# Patient Record
Sex: Male | Born: 1990 | Race: White | Hispanic: No | Marital: Single | Smoking: Current every day smoker
Health system: Southern US, Community
[De-identification: ages and names within clinical notes are randomized; demographics above are authoritative.]

## PROBLEM LIST (undated history)

## (undated) DIAGNOSIS — J45909 Unspecified asthma, uncomplicated: Secondary | ICD-10-CM

---

## 2013-11-28 ENCOUNTER — Encounter (HOSPITAL_COMMUNITY): Payer: Self-pay | Admitting: Emergency Medicine

## 2013-11-28 ENCOUNTER — Emergency Department (HOSPITAL_COMMUNITY): Payer: Managed Care, Other (non HMO)

## 2013-11-28 ENCOUNTER — Emergency Department (HOSPITAL_COMMUNITY)
Admission: EM | Admit: 2013-11-28 | Discharge: 2013-11-28 | Disposition: A | Discharge status: Home / Self Care | Payer: Managed Care, Other (non HMO) | Attending: Emergency Medicine | Admitting: Emergency Medicine

## 2013-11-28 DIAGNOSIS — F17200 Nicotine dependence, unspecified, uncomplicated: Secondary | ICD-10-CM | POA: Diagnosis not present

## 2013-11-28 DIAGNOSIS — J40 Bronchitis, not specified as acute or chronic: Secondary | ICD-10-CM

## 2013-11-28 DIAGNOSIS — J45901 Unspecified asthma with (acute) exacerbation: Secondary | ICD-10-CM

## 2013-11-28 HISTORY — DX: Unspecified asthma, uncomplicated: J45.909

## 2013-11-28 MED ORDER — IBUPROFEN 800 MG PO TABS
800.0000 mg | ORAL_TABLET | Freq: Once | ORAL | Status: AC
Start: 2013-11-28 — End: 2013-11-28
  Administered 2013-11-28: 800 mg via ORAL
  Filled 2013-11-28: qty 1

## 2013-11-28 MED ORDER — IPRATROPIUM-ALBUTEROL 0.5-2.5 (3) MG/3ML IN SOLN
3.0000 mL | Freq: Once | RESPIRATORY_TRACT | Status: AC
  Administered 2013-11-28: 3 mL via RESPIRATORY_TRACT
  Filled 2013-11-28: qty 3

## 2013-11-28 MED ORDER — PREDNISONE 10 MG PO TABS: 60.0000 mg | ORAL_TABLET | Freq: Every day | ORAL | Status: DC

## 2013-11-28 MED ORDER — ALBUTEROL SULFATE (2.5 MG/3ML) 0.083% IN NEBU
5.0000 mg | INHALATION_SOLUTION | Freq: Once | RESPIRATORY_TRACT | Status: AC
  Administered 2013-11-28: 5 mg via RESPIRATORY_TRACT
  Filled 2013-11-28: qty 6

## 2013-11-28 MED ORDER — ALBUTEROL SULFATE HFA 108 (90 BASE) MCG/ACT IN AERS: 2.0000 | INHALATION_SPRAY | RESPIRATORY_TRACT | Status: DC | PRN

## 2013-11-28 MED ORDER — PREDNISONE 20 MG PO TABS
60.0000 mg | ORAL_TABLET | Freq: Once | ORAL | Status: AC
  Administered 2013-11-28: 60 mg via ORAL
  Filled 2013-11-28: qty 3

## 2013-11-28 MED ORDER — AZITHROMYCIN 250 MG PO TABS: 250.0000 mg | ORAL_TABLET | Freq: Every day | ORAL | Status: DC

## 2013-11-28 NOTE — Progress Notes (Signed)
  CARE MANAGEMENT ED NOTE 11/28/2013  Patient:  Micheal Joseph, Micheal Joseph   Account Number:  [id number]  Date Initiated:  11/28/2013  Documentation initiated by:  Edd Arbour  Subjective/Objective Assessment:   23 yr old aetna managed pt with [place] address asthma excerebration and productive cough with thick green sputum starting yesterday     Subjective/Objective Assessment Detail:   Pt states he had no copy of his card only his provider #     Action/Plan:   Cm discussed importance of f/u with Discussed how to obtain a pcp via [email or web address] or toll free number provided to pt Discussed in network providers   Action/Plan Detail:   Anticipated DC Date:  11/28/2013     Status Recommendation to Physician:   Result of Recommendation:    Other ED Services  Consult Working Plan    DC Planning Services  Other  PCP issues  Outpatient Services - Pt will follow up    Choice offered to / List presented to:            Status of service:  Completed, signed off  ED Comments:   ED Comments Detail:  Provided pt with AETNA/AETNA MANAGED    Subscriber Subscriber #  Micheal Joseph, Micheal Joseph [id number]   Address Phone  [address] [address] [place], [place] [address] [phone] [email or web address] click find a doctor

## 2013-11-28 NOTE — ED Notes (Signed)
RT called

## 2013-11-28 NOTE — Progress Notes (Signed)
[facility] Community Liaison Stacy, ° °Provided pt with a list of primary care resources to help patient establish a pcp.  °

## 2013-11-28 NOTE — ED Notes (Signed)
Pt back from x-ray.

## 2013-11-28 NOTE — Discharge Instructions (Signed)

## 2013-11-28 NOTE — ED Notes (Signed)
md at bedside

## 2013-11-28 NOTE — ED Notes (Signed)
RT at bedside.

## 2013-11-28 NOTE — ED Notes (Signed)
Per pt asthma excerebration and productive cough with thick green sputum starting yesterday. Pt reports has inhaler but did not use it.

## 2013-11-28 NOTE — ED Notes (Signed)
Pt alert and oriented x4. Respirations even and unlabored, bilateral symmetrical rise and fall of chest. Skin warm and dry. In no acute distress. Denies needs.   

## 2013-11-28 NOTE — Progress Notes (Deleted)
[facility] Community Liaison Stacy, ° °Provided pt with a list of primary care resources to help patient establish a pcp.  °

## 2013-11-28 NOTE — ED Notes (Signed)
Pt c/o shob, that started yesterday. Pt has PMH asthma and doesn't have an inhaler at home.

## 2013-11-30 NOTE — ED Provider Notes (Signed)
CSN: [id number]     Arrival date & time 11/28/13  4098 History   First MD Initiated Contact with Patient 11/28/13 0932     Chief Complaint  Patient presents with  . Asthma      HPI Patient reports mild shortness of breath since yesterday.  Patient has a history of asthma.  He is currently out of his albuterol inhaler.  He reports productive cough and chills.  No documented fever.  He denies chest pain.  No weakness or lightheadedness.  Denies abdominal pain.  Denies unilateral leg swelling.  History of DVT or pulmonary embolism.  He continues to smoke cigarettes.no other complaints her symptoms are mild in severity   Past Medical History  Diagnosis Date  . Asthma    History reviewed. No pertinent past surgical history. No family history on file. History  Substance Use Topics  . Smoking status: Current Every Day Smoker    Types: Cigarettes  . Smokeless tobacco: Not on file  . Alcohol Use: Yes     Comment: social     Review of Systems  All other systems reviewed and are negative.     Allergies  Review of patient's allergies indicates no known allergies.  Home Medications  Albuterol inhaler   BP 104/56  Pulse 78  Temp(Src) 98.2 F (36.8 C) (Oral)  Resp 16  SpO2 97% Physical Exam  Nursing note and vitals reviewed. Constitutional: He is oriented to person, place, and time. He appears well-developed and well-nourished.  HENT:  Head: Normocephalic and atraumatic.  Eyes: EOM are normal.  Neck: Normal range of motion.  Cardiovascular: Normal rate, regular rhythm, normal heart sounds and intact distal pulses.   Pulmonary/Chest: Effort normal. No respiratory distress. He has wheezes.  Abdominal: Soft. He exhibits no distension. There is no tenderness.  Musculoskeletal: Normal range of motion.  Neurological: He is alert and oriented to person, place, and time.  Skin: Skin is warm and dry.  Psychiatric: He has a normal mood and affect. Judgment normal.    ED Course   Procedures (including critical care time) Labs Review Labs Reviewed - No data to display  Imaging Review Dg Chest 2 View  11/28/2013   CLINICAL DATA:  Cough and congestion and shortness of breath with history of asthma as well as tobacco use  EXAM: CHEST  2 VIEW  COMPARISON:  None.  FINDINGS: The lungs are well-expanded. The interstitial markings are coarse bilaterally. Slightly increased density overlying the lower thoracic spine may reflect fibrosis or subsegmental atelectasis. There is no alveolar pneumonia. The heart and pulmonary vascularity are unremarkable. The bony thorax is within the limits of normal.  IMPRESSION: Increased lung markings bilaterally are consistent with the patient's smoking history and history of reactive airway disease. There may be subsegmental atelectasis in the left lower lobe posteriorly. Followup films following anticipated therapy are recommended if the patient's cough persists.   Electronically Signed   By: David  [place]   On: 11/28/2013 12:14     EKG Interpretation None      MDM   Final diagnoses:  Asthma exacerbation  Bronchitis    Patient feels much better in the emergency department after treatment with bronchodilators.  Patient be placed on prednisone.  Questionable pneumonia clinically.  Abnormal chest x-ray.  Patient was placed on azithromycin.  He understands the importance of followup chest x-ray after symptoms have resolved.    Lyanne Co, MD 11/30/13 [phone]

## 2013-12-02 ENCOUNTER — Ambulatory Visit (INDEPENDENT_AMBULATORY_CARE_PROVIDER_SITE_OTHER): Payer: Managed Care, Other (non HMO)

## 2013-12-02 ENCOUNTER — Ambulatory Visit (INDEPENDENT_AMBULATORY_CARE_PROVIDER_SITE_OTHER): Payer: Managed Care, Other (non HMO) | Admitting: Family Medicine

## 2013-12-02 VITALS — BP 122/84 | HR 85 | Temp 98.4°F | Resp 16 | Ht 65.5 in | Wt 147.0 lb

## 2013-12-02 DIAGNOSIS — Z7185 Encounter for immunization safety counseling: Secondary | ICD-10-CM

## 2013-12-02 DIAGNOSIS — J45909 Unspecified asthma, uncomplicated: Secondary | ICD-10-CM | POA: Insufficient documentation

## 2013-12-02 DIAGNOSIS — R062 Wheezing: Secondary | ICD-10-CM

## 2013-12-02 DIAGNOSIS — Z23 Encounter for immunization: Secondary | ICD-10-CM

## 2013-12-02 DIAGNOSIS — Z7189 Other specified counseling: Secondary | ICD-10-CM

## 2013-12-02 NOTE — Progress Notes (Signed)
Subjective:    Patient ID: Micheal Joseph, male    DOB: 1990-04-02, 23 y.o.   MRN: [id number]  HPI  This 23 y.o. Male is new to [facility]; he presents today for required immunizations for college enrollment. He has been in the [place] x 3 years and had medical records when he arrived. He was enrolled at [employer] for 1 week but found that they did not have the engineering program he needs so he withdrew. He registered at [employer] and needs evidence of Tetanus & MMR immunizations. He states he has lost his medical record (immunizations) and did not realize this until this morning. He is unable to contact his father to get records faxed due to time difference (father lives in [place]). Form states he has to have TB skin test or xray showing he is free of disease. He needs form completed today.  Pt has Asthma and was treated at ED on 11/28/2013. He still has cough and wheezing; he did get MDI prescription but not Z-PAK or steroids. HE states he feels better.  Patient Active Problem List   Diagnosis Date Noted  . Unspecified asthma(493.90) 12/02/2013   Prior to Admission medications   Medication Sig Start Date End Date Taking? Authorizing Provider  albuterol (PROVENTIL HFA;VENTOLIN HFA) 108 (90 BASE) MCG/ACT inhaler Inhale 2 puffs into the lungs every 4 (four) hours as needed for wheezing or shortness of breath. 11/28/13  Yes Hoy Morn, MD  azithromycin (ZITHROMAX Z-PAK) 250 MG tablet Take 1 tablet (250 mg total) by mouth daily. Take 2 tabs for first dose, then 1 tab for each additional dose 11/28/13   Hoy Morn, MD    History   Social History  . Marital Status: Single    Spouse Name: N/A    Number of Children: N/A  . Years of Education: N/A   Occupational History  . Not on file.   Social History Main Topics  . Smoking status: Current Every Day Smoker    Types: Cigarettes  . Smokeless tobacco: Not on file  . Alcohol Use: Yes     Comment:  social   . Drug Use: Yes    Special: Marijuana  . Sexual Activity: Not on file   Other Topics Concern  . Not on file   Social History Narrative  . No narrative on file    Review of Systems  Constitutional: Negative.   HENT: Positive for congestion, postnasal drip and sore throat. Negative for rhinorrhea, sinus pressure, trouble swallowing and voice change.   Eyes: Negative.   Respiratory: Positive for cough, shortness of breath and wheezing. Negative for choking and chest tightness.   Cardiovascular: Negative.   Musculoskeletal: Negative.   Skin: Negative.   Allergic/Immunologic: Positive for environmental allergies.  Neurological: Negative.   Psychiatric/Behavioral: Negative.       Objective:   Physical Exam  Nursing note and vitals reviewed. Constitutional: He is oriented to person, place, and time. Vital signs are normal. He appears well-developed and well-nourished. No distress.  HENT:  Head: Normocephalic and atraumatic.  Right Ear: Hearing, tympanic membrane, external ear and ear canal normal.  Left Ear: Hearing, tympanic membrane, external ear and ear canal normal.  Nose: Nose normal. No nasal deformity or septal deviation.  Mouth/Throat: Uvula is midline and mucous membranes are normal. No oral lesions. Normal dentition. No dental caries. Posterior oropharyngeal erythema present. No oropharyngeal exudate or posterior oropharyngeal edema.  Eyes: EOM and lids are normal. Pupils  are equal, round, and reactive to light. Right conjunctiva is injected. Left conjunctiva is injected. No scleral icterus.  Neck: Trachea normal, normal range of motion, full passive range of motion without pain and phonation normal. Neck supple. No spinous process tenderness and no muscular tenderness present. No mass and no thyromegaly present.  Cardiovascular: Normal rate, regular rhythm, S1 normal, S2 normal and normal heart sounds.   No extrasystoles are present. PMI is not displaced.  Exam  reveals no gallop and no friction rub.   No murmur heard. Pulmonary/Chest: Effort normal. No respiratory distress. He has no decreased breath sounds. He has wheezes. He exhibits no tenderness.  Musculoskeletal: Normal range of motion. He exhibits no edema and no tenderness.  Neurological: He is alert and oriented to person, place, and time. No cranial nerve deficit. He exhibits normal muscle tone. Coordination normal.  Skin: Skin is warm and dry. He is not diaphoretic.  Psychiatric: He has a normal mood and affect. His behavior is normal. Judgment and thought content normal.    [facility] reading (PRIMARY) by  Dr. Leward Quan: CXR- Normal heart size; no active lung disease. Bony thorax is normal.     Assessment & Plan:  Immunization counseling - Tdap and MMR given.  Unspecified asthma(493.90)- Use Albuterol MDI as prescribed. Return for worsening of symptoms.  Wheezing - Pt did not adhere to treatment plan.  Plan: DG Chest 2 View

## 2013-12-02 NOTE — Progress Notes (Signed)
  Tuberculosis Risk Questionnaire  1. Yes [place] Were you born outside the [place] in one of the following parts of the world: [place], [place], [place], [place] or [place]?    2. Yes  Have you traveled outside the [place] and lived for more than one month in one of the following parts of the world: [place], [place], [place], [place] or [place]?    3. No Do you have a compromised immune system such as from any of the following conditions:HIV/AIDS, organ or bone marrow transplantation, diabetes, immunosuppressive medicines (e.g. Prednisone, Remicaide), leukemia, lymphoma, cancer of the head or neck, gastrectomy or jejunal bypass, end-stage renal disease (on dialysis), or silicosis?     4. No Have you ever or do you plan on working in: a residential care center, a health care facility, a jail or prison or homeless shelter?    5. No Have you ever: injected illegal drugs, used crack cocaine, lived in a homeless shelter  or been in jail or prison?     6. No Have you ever been exposed to anyone with infectious tuberculosis?    Tuberculosis Symptom Questionnaire  Do you currently have any of the following symptoms?  1. No Unexplained cough lasting more than 3 weeks?   2. No Unexplained fever lasting more than 3 weeks.   3. No Night Sweats (sweating that leaves the bedclothes and sheets wet)     4. No Shortness of Breath   5. No Chest Pain   6. No Unintentional weight loss    7. No Unexplained fatigue (very tired for no reason)

## 2013-12-04 ENCOUNTER — Encounter: Payer: Self-pay | Admitting: Family Medicine

## 2013-12-09 ENCOUNTER — Ambulatory Visit (INDEPENDENT_AMBULATORY_CARE_PROVIDER_SITE_OTHER): Payer: Managed Care, Other (non HMO) | Admitting: Family Medicine

## 2013-12-09 VITALS — BP 106/58 | HR 72 | Temp 98.2°F | Resp 16 | Ht 65.25 in | Wt 146.6 lb

## 2013-12-09 DIAGNOSIS — J209 Acute bronchitis, unspecified: Secondary | ICD-10-CM

## 2013-12-09 DIAGNOSIS — J309 Allergic rhinitis, unspecified: Secondary | ICD-10-CM

## 2013-12-09 DIAGNOSIS — R05 Cough: Secondary | ICD-10-CM

## 2013-12-09 DIAGNOSIS — R059 Cough, unspecified: Secondary | ICD-10-CM

## 2013-12-09 MED ORDER — HYDROCOD POLST-CHLORPHEN POLST 10-8 MG/5ML PO LQCR: 5.0000 mL | Freq: Every evening | ORAL | Status: DC | PRN

## 2013-12-09 MED ORDER — PREDNISONE 20 MG PO TABS: 20.0000 mg | ORAL_TABLET | Freq: Two times a day (BID) | ORAL | Status: DC

## 2013-12-09 NOTE — Progress Notes (Deleted)
   Subjective:    Patient ID: Micheal Joseph, male    DOB: 09-21-1990, 23 y.o.   MRN: [id number]  HPI    Review of Systems     Objective:   Physical Exam        Assessment & Plan:

## 2013-12-09 NOTE — Patient Instructions (Signed)
Can take Delsym for cough during the day Please use afrin nasal spray (or generic) for up to 4 days as needed for nasal congestion Please take loratadine 10 mg (antihistamine) for 1-2 weeks for congestion Please return if you don't feel better in 4-5 days or if you get worse- high fever greater than 101, vomiting, shortness of breath that doesn't improve with inhaler.  Allergic Rhinitis Allergic rhinitis is when the mucous membranes in the nose respond to allergens. Allergens are particles in the air that cause your body to have an allergic reaction. This causes you to release allergic antibodies. Through a chain of events, these eventually cause you to release histamine into the blood stream. Although meant to protect the body, it is this release of histamine that causes your discomfort, such as frequent sneezing, congestion, and an itchy, runny nose.  CAUSES  Seasonal allergic rhinitis (hay fever) is caused by pollen allergens that may come from grasses, trees, and weeds. Year-round allergic rhinitis (perennial allergic rhinitis) is caused by allergens such as house dust mites, pet dander, and mold spores.  SYMPTOMS   Nasal stuffiness (congestion).  Itchy, runny nose with sneezing and tearing of the eyes. DIAGNOSIS  Your health care provider can help you determine the allergen or allergens that trigger your symptoms. If you and your health care provider are unable to determine the allergen, skin or blood testing may be used. TREATMENT  Allergic rhinitis does not have a cure, but it can be controlled by:  Medicines and allergy shots (immunotherapy).  Avoiding the allergen. Hay fever may often be treated with antihistamines in pill or nasal spray forms. Antihistamines block the effects of histamine. There are over-the-counter medicines that may help with nasal congestion and swelling around the eyes. Check with your health care provider before taking or giving this medicine.  If avoiding the  allergen or the medicine prescribed do not work, there are many new medicines your health care provider can prescribe. Stronger medicine may be used if initial measures are ineffective. Desensitizing injections can be used if medicine and avoidance does not work. Desensitization is when a patient is given ongoing shots until the body becomes less sensitive to the allergen. Make sure you follow up with your health care provider if problems continue. HOME CARE INSTRUCTIONS It is not possible to completely avoid allergens, but you can reduce your symptoms by taking steps to limit your exposure to them. It helps to know exactly what you are allergic to so that you can avoid your specific triggers. SEEK MEDICAL CARE IF:   You have a fever.  You develop a cough that does not stop easily (persistent).  You have shortness of breath.  You start wheezing.  Symptoms interfere with normal daily activities. Document Released: 12/10/2000 Document Revised: 03/22/2013 Document Reviewed: 11/22/2012 [facility] Patient Information 2015 [place], [place]. This information is not intended to replace advice given to you by your health care provider. Make sure you discuss any questions you have with your health care provider.

## 2013-12-09 NOTE — Progress Notes (Signed)
   Subjective:    Patient ID: Micheal Joseph, male    DOB: Mar 04, 1991, 23 y.o.   MRN: [id number]  HPI Patients presents today for follow up of continued cough. He was seen in ER 8/31 and given zpack and albuterol inhaler. He was seen 9/4 by Dr. Audria Nine. At that time he was feeling better and had a negative CXR. He has continued to improve with decreased fever and improved energy, but he continues to cough. He has a childhood history of asthma, but reports that he has not had symptoms except when he has an illness. He is able to exercise without sob or wheeze.   He has been his albuterol inhaler 2x day without relief. He is concerned about using it too frequently. He typically smokes about 1ppd, he hasn't been smoking much recently with his illness.   Review of Systems No fever recently, no ear pain, some pressure, chest pain with cough, occasional headache, some wheezing 2-3 days ago, no sore throat.    Objective:   Physical Exam  Vitals reviewed. Constitutional: He is oriented to person, place, and time. He appears well-developed and well-nourished.  HENT:  Head: Normocephalic and atraumatic.  Right Ear: Tympanic membrane, external ear and ear canal normal.  Left Ear: Tympanic membrane, external ear and ear canal normal.  Nose: Mucosal edema and rhinorrhea present.  Mouth/Throat: Uvula is midline, oropharynx is clear and moist and mucous membranes are normal.  Post nasal drainage noted.  Eyes: Conjunctivae are normal.  Neck: Normal range of motion. Neck supple.  Cardiovascular: Normal rate, regular rhythm and normal heart sounds.   Pulmonary/Chest: Effort normal and breath sounds normal.  Musculoskeletal: Normal range of motion.  Lymphadenopathy:    He has no cervical adenopathy.  Neurological: He is alert and oriented to person, place, and time.  Skin: Skin is warm and dry.  Psychiatric: He has a normal mood and affect. His behavior is normal. Judgment and thought content  normal.      Assessment & Plan:  1. Acute bronchitis, unspecified organism with continued reactive airway - he completed his course of azithromycin and no longer has fever, he is feeling well other than unresolved cough. - predniSONE (DELTASONE) 20 MG tablet; Take 1 tablet (20 mg total) by mouth 2 (two) times daily with a meal.  Dispense: 10 tablet; Refill: 0 -continue albuterol inhaler every 4 hours as needed -encouraged smoking cessation. 2. Cough - predniSONE (DELTASONE) 20 MG tablet; Take 1 tablet (20 mg total) by mouth 2 (two) times daily with a meal.  Dispense: 10 tablet; Refill: 0 -tussionex 5 ml qhs prn  3. Allergic rhinitis, unspecified allergic rhinitis type -Provided written and verbal information regarding diagnosis and treatment. -Can take Delsym for cough during the day Please use afrin nasal spray (or generic) for up to 4 days as needed for nasal congestion Please take loratadine 10 mg (antihistamine) for 1-2 weeks for congestion Please return if you don't feel better in 4-5 days or if you get worse- high fever greater than 101, vomiting, shortness of breath that doesn't improve with inhaler.     Micheal Belfast, FNP-BC  [facility] [place], [facility]  12/09/2013 10:36 AM

## 2014-01-17 ENCOUNTER — Ambulatory Visit (INDEPENDENT_AMBULATORY_CARE_PROVIDER_SITE_OTHER): Payer: Managed Care, Other (non HMO) | Admitting: Family Medicine

## 2014-01-17 VITALS — BP 114/80 | HR 66 | Temp 97.7°F | Resp 16 | Ht 65.5 in | Wt 146.0 lb

## 2014-01-17 DIAGNOSIS — F900 Attention-deficit hyperactivity disorder, predominantly inattentive type: Secondary | ICD-10-CM

## 2014-01-17 DIAGNOSIS — F988 Other specified behavioral and emotional disorders with onset usually occurring in childhood and adolescence: Secondary | ICD-10-CM

## 2014-01-17 NOTE — Progress Notes (Signed)
[facility] [address], [place] [id number] [phone]  Date:  01/17/2014   Name:  Micheal Joseph   DOB:  03/07/1991   MRN:  [id number]  PCP:  No PCP Per Patient    Chief Complaint: ADHD   History of Present Illness:  Micheal Joseph is a 23 y.o. 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  He notes that he has a hard time focusing on his work.   Apparently his [place] instructor asked him to get tested.  This is his first semester at [employer].   He was never told that he has any trouble as a child with ADHD.  When he was in [place] he did not have any difficulty with concentration.   He does not have any trouble with focusing on other things- no issues with his social or home life.   He does not have trouble sitting still or with interrupting others.   He is generally in good health except for asthma.    Patient Active Problem List   Diagnosis Date Noted  . Unspecified asthma(493.90) 12/02/2013    Past Medical History  Diagnosis Date  . Asthma     History reviewed. No pertinent past surgical history.  History  Substance Use Topics  . Smoking status: Current Every Day Smoker    Types: Cigarettes  . Smokeless tobacco: Not on file  . Alcohol Use: Yes     Comment: social     History reviewed. No pertinent family history.  No Known Allergies  Medication list has been reviewed and updated.  No current outpatient prescriptions on file prior to visit.   No current facility-administered medications on file prior to visit.    Review of Systems:  As per HPI- otherwise negative.   Physical Examination: Filed Vitals:   01/17/14 1306  BP: 114/80  Pulse: 66  Temp: 97.7 F (36.5 C)  Resp: 16   Filed Vitals:   01/17/14 1306  Height: 5' 5.5" (1.664 m)  Weight: 146 lb (66.225 kg)   Body mass index is 23.92 kg/(m^2). Ideal Body Weight: Weight  in (lb) to have BMI = 25: 152.2  GEN: WDWN, NAD, Non-toxic, A & O x 3, well appearing HEENT: Atraumatic, Normocephalic. Neck supple. No masses, No LAD. Ears and Nose: No external deformity. CV: RRR, No M/G/R. No JVD. No thrill. No extra heart sounds. PULM: CTA B, no wheezes, crackles, rhonchi. No retractions. No resp. distress. No accessory muscle use. EXTR: No c/c/e NEURO Normal gait.  PSYCH: Normally interactive. Conversant. Not depressed or anxious appearing.  Calm demeanor.   Did ADHD self- assessment with him.  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  We did form together as he has some limitations with english vocabulary  Assessment and Plan: ADD (attention deficit disorder) without hyperactivity  Likely ADD.  He will call a psychologist and schedule a formal evaluation.  If evaluation is positive I am glad to rx treatment.    Signed Abbe AmsterdamJessica Kevonta Phariss, MD

## 2014-01-17 NOTE — Patient Instructions (Signed)
You need to be formally tested for ADD (attention deficit disorder).   Please call the psychologist of your choice for testing Some options are: [facility]  [phone]  [phone] [facility]  [phone] [facility]  [phone]  If you do indeed have ADD, I can start medication therapy for you

## 2014-02-08 ENCOUNTER — Ambulatory Visit (INDEPENDENT_AMBULATORY_CARE_PROVIDER_SITE_OTHER): Payer: Managed Care, Other (non HMO) | Admitting: Family Medicine

## 2014-02-08 VITALS — BP 112/80 | HR 83 | Temp 98.0°F | Resp 16 | Ht 65.25 in | Wt 137.2 lb

## 2014-02-08 DIAGNOSIS — F909 Attention-deficit hyperactivity disorder, unspecified type: Secondary | ICD-10-CM

## 2014-02-08 DIAGNOSIS — F988 Other specified behavioral and emotional disorders with onset usually occurring in childhood and adolescence: Secondary | ICD-10-CM

## 2014-02-08 DIAGNOSIS — R0982 Postnasal drip: Secondary | ICD-10-CM

## 2014-02-08 MED ORDER — IPRATROPIUM BROMIDE 0.03 % NA SOLN: 2.0000 | Freq: Four times a day (QID) | NASAL | Status: AC

## 2014-02-08 NOTE — Progress Notes (Signed)
[facility] [address], [place] [id number] ([phone]  Date:  02/08/2014   Name:  Micheal DykesWael M Campisi   DOB:  02-20-1991   MRN:  [id number]  PCP:  No PCP Per Patient    Chief Complaint: Nasal Congestion and discuss ADD   History of Present Illness:  Micheal DykesWael M Collinsworth is a [age] y.o. very pleasant male patient who presents with the following:  He was seen last month and we tried to get him in with someone to formally test him for suspected ADHD- however he has not been able to find anytone to do this testing for him.  Suspect he has some difficulty navigating the process of calling and making his own new pt appt as his English is somewhat limited  He notes that he has a runny nose- "like water"- this just started yesterday.   No cough, no body aches or fever.  His main problem is just the runny nose.   No vomiting.  He otherwise feels well   Patient Active Problem List   Diagnosis Date Noted  . Unspecified asthma(493.90) 12/02/2013    Past Medical History  Diagnosis Date  . Asthma     History reviewed. No pertinent past surgical history.  History  Substance Use Topics  . Smoking status: Current Every Day Smoker    Types: Cigarettes  . Smokeless tobacco: Not on file  . Alcohol Use: Yes     Comment: social     202 History reviewed. No pertinent family history.  No Known Allergies  Medication list has been reviewed and updated.  No current outpatient prescriptions on file prior to visit.   No current facility-administered medications on file prior to visit.    Review of Systems:  As per HPI- otherwise negative.   Physical Examination: Filed Vitals:   02/08/14 1525  BP: 112/80  Pulse: 83  Temp: 98 F (36.7 C)  Resp: 16   Filed Vitals:   02/08/14 1525  Height: 5' 5.25" (1.657 m)  Weight: 137 lb 3.2 oz (62.234 kg)   Body mass index is 22.67 kg/(m^2). Ideal Body Weight: Weight in (lb) to have BMI = 25: 151.1  GEN: WDWN,  NAD, Non-toxic, A & O x 3, looks well HEENT: Atraumatic, Normocephalic. Neck supple. No masses, No LAD.  Bilateral TM wnl, oropharynx normal.  PEERL,EOMI.   Ears and Nose: No external deformity. CV: RRR, No M/G/R. No JVD. No thrill. No extra heart sounds. PULM: CTA B, no wheezes, crackles, rhonchi. No retractions. No resp. distress. No accessory muscle use. EXTR: No c/c/e NEURO Normal gait.  PSYCH: Normally interactive. Conversant. Not depressed or anxious appearing.  Calm demeanor.    Assessment and Plan: PND (post-nasal drip) - Plan: ipratropium (ATROVENT) 0.03 % nasal spray  Attention deficit disorder - Plan: Ambulatory referral to Psychology  Treat for PND with atrovent nasal. We do not usually make referral for psychological services but will ask referral to try and help with the process for this particular patient as he is having some difficulty   Signed Cristella Stiver, MD [phone]- pts best number

## 2014-02-08 NOTE — Patient Instructions (Signed)
Use the nasal spray as needed for runny nose.  Let me know if you do not feel better soon We will get your set up to see someone for ADHD diagnosis.

## 2015-01-20 IMAGING — CR DG CHEST 2V
2 series · 2 of 2 positions shown · non-contrast
Comparison: None.

CLINICAL DATA: Cough and congestion and shortness of breath with
history of asthma as well as tobacco use

EXAM:
CHEST  2 VIEW

[w chest pa]
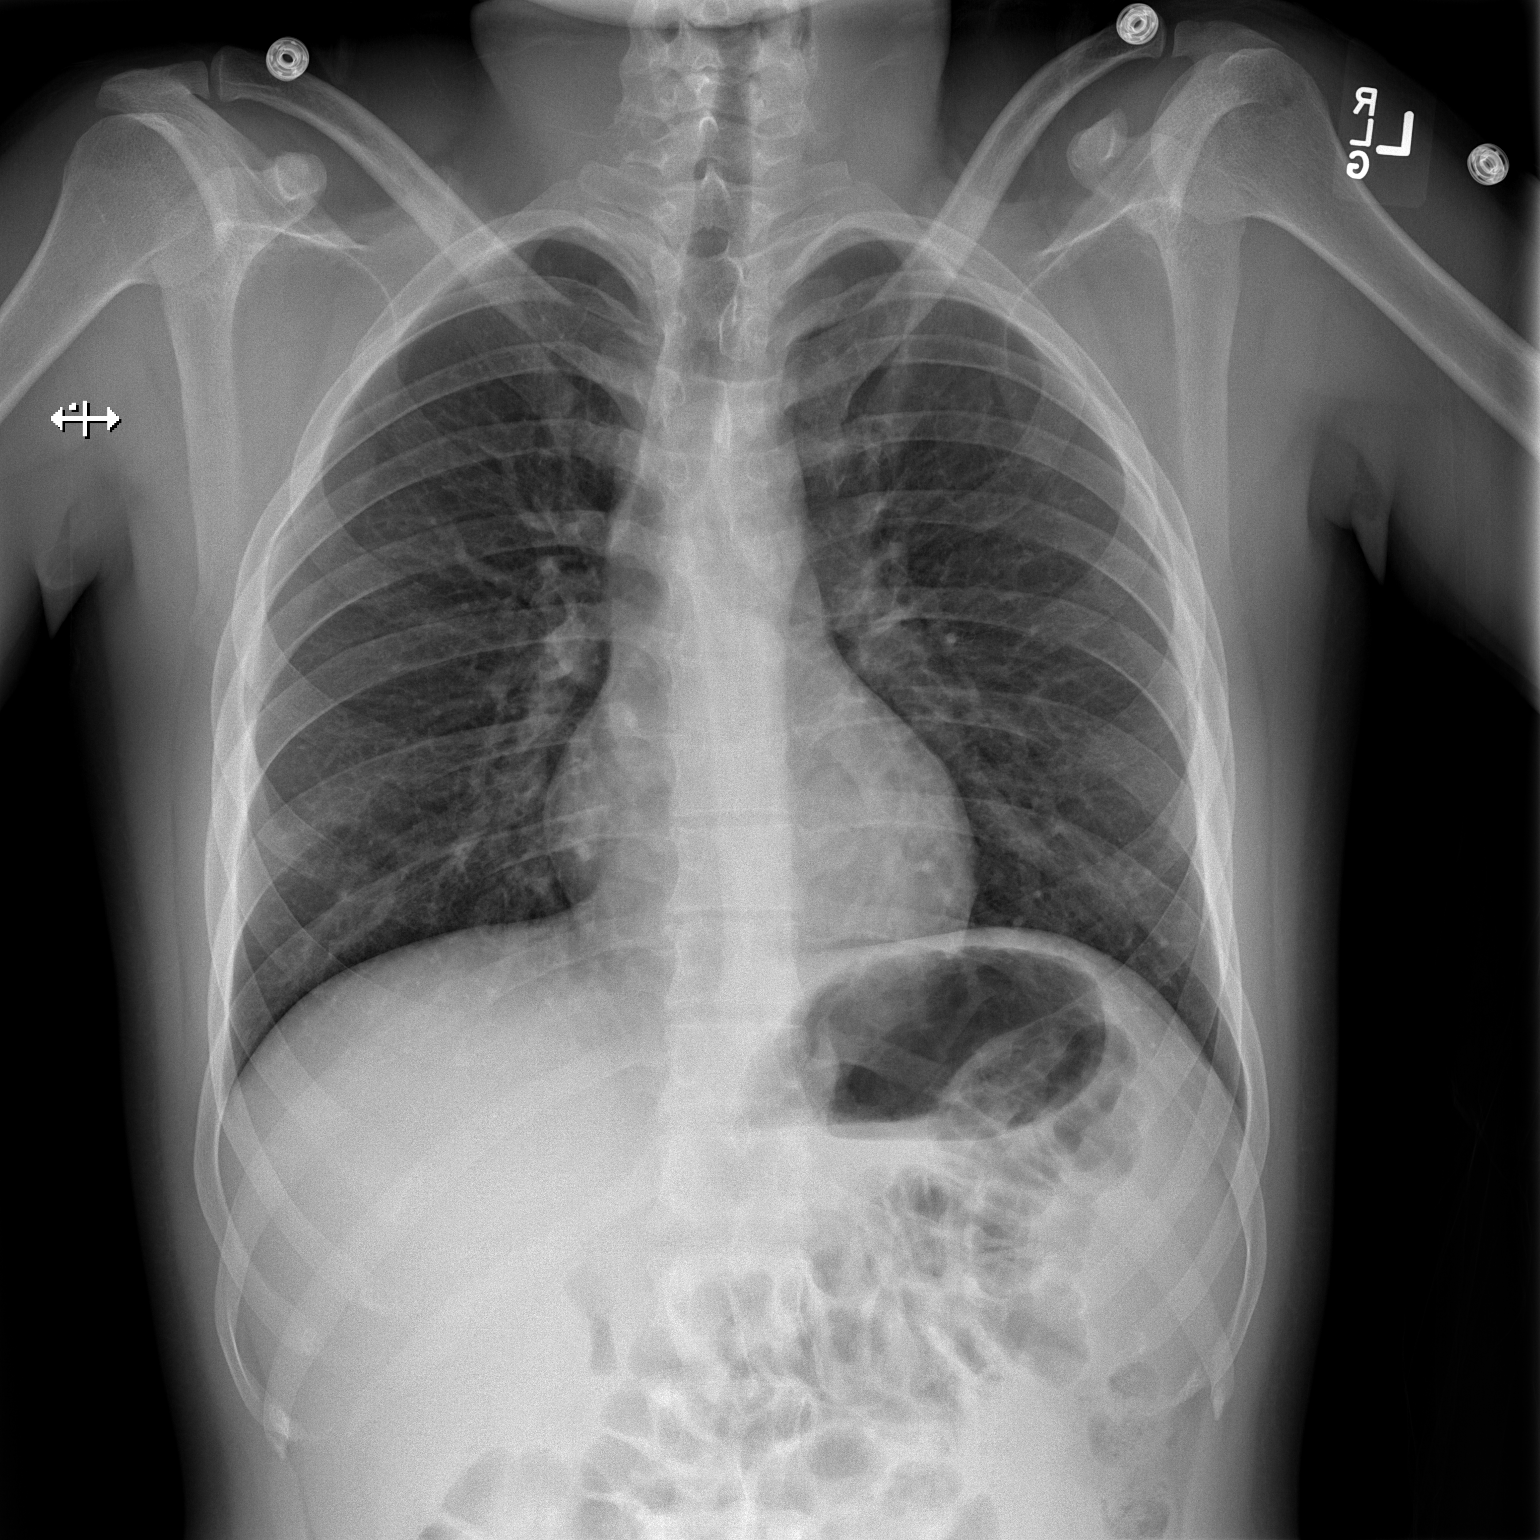

[w chest lat]
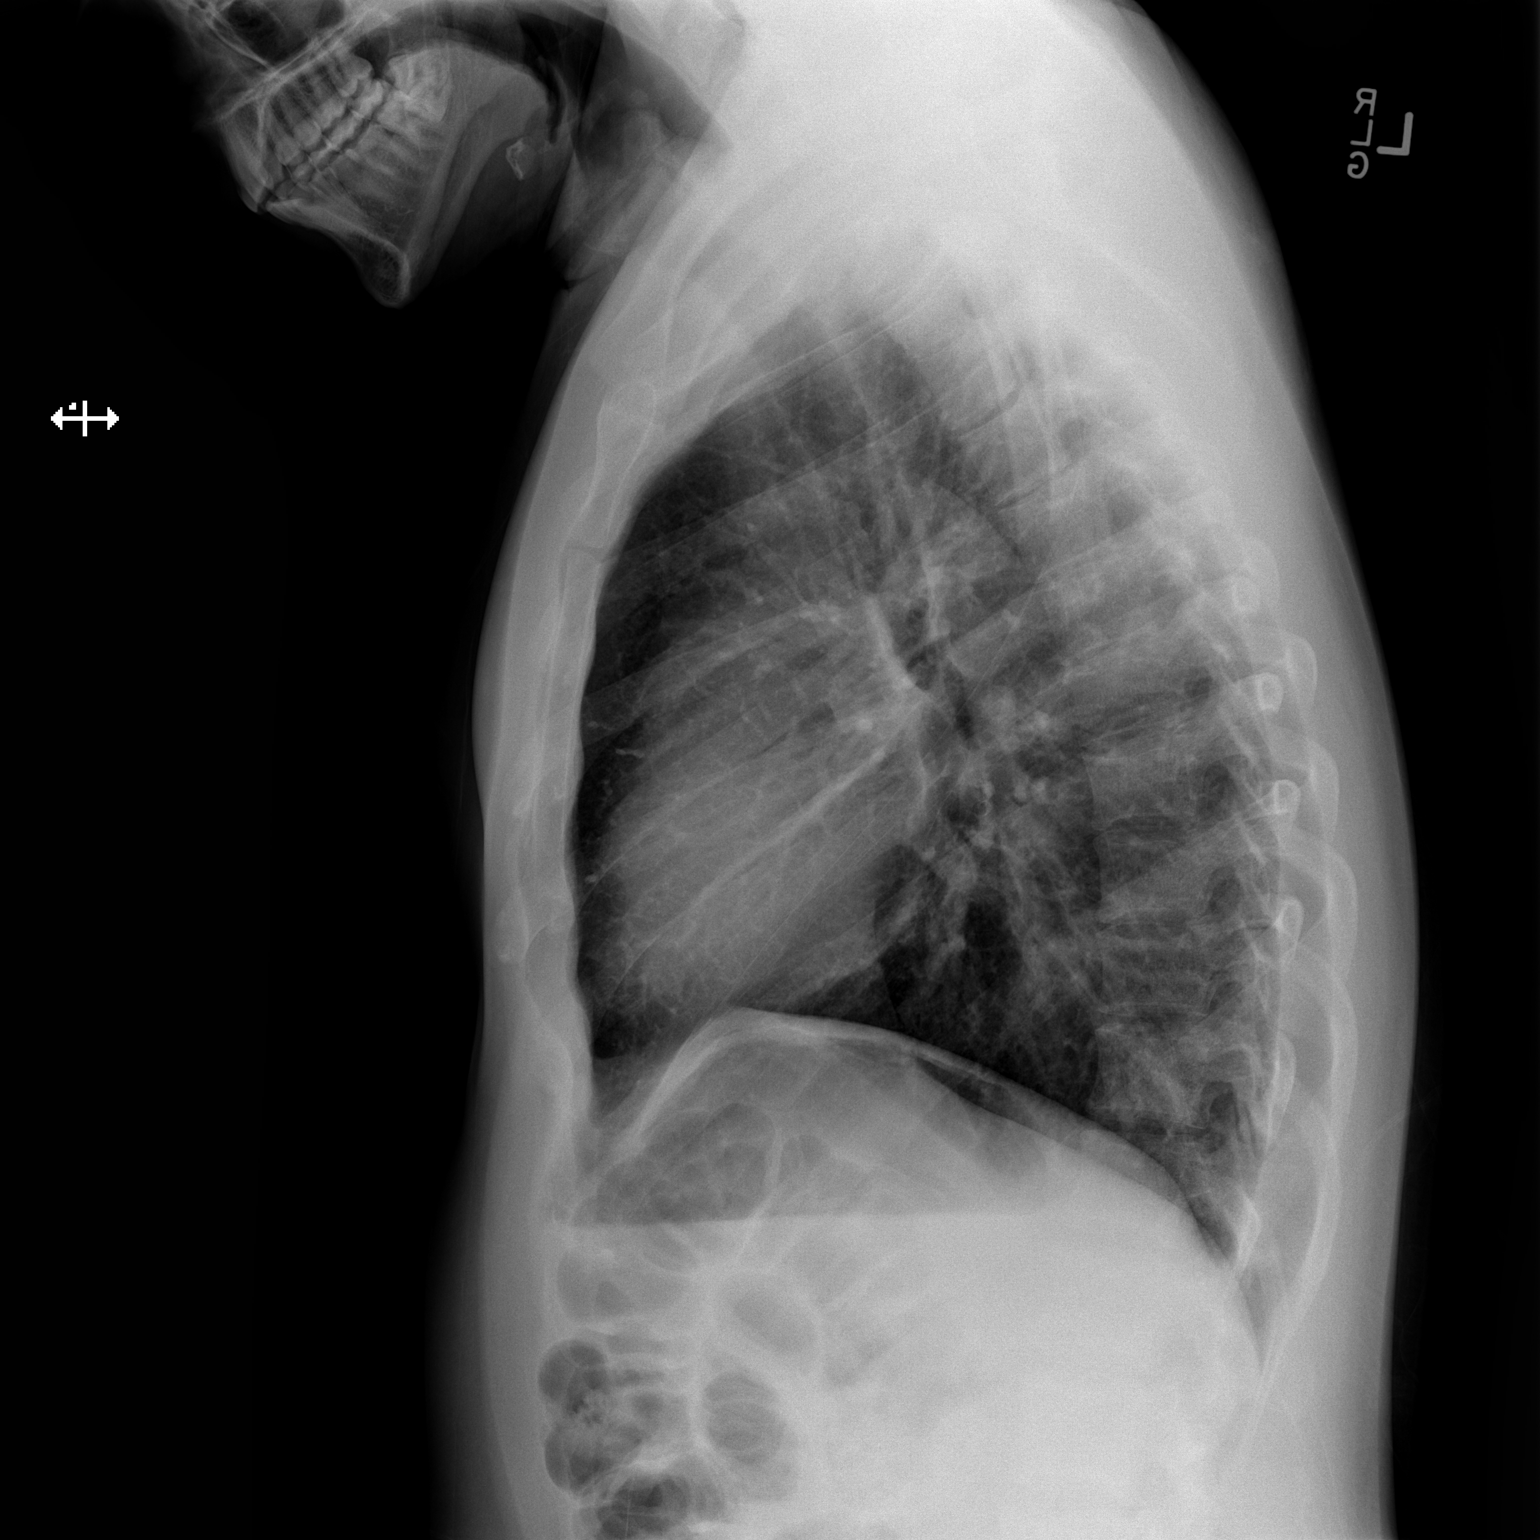

[2 of 2 positions shown; findings below may reference images not displayed]

FINDINGS: The lungs are well-expanded. The interstitial markings are coarse
bilaterally. Slightly increased density overlying the lower thoracic
spine may reflect fibrosis or subsegmental atelectasis. There is no
alveolar pneumonia. The heart and pulmonary vascularity are
unremarkable. The bony thorax is within the limits of normal.
IMPRESSION: Increased lung markings bilaterally are consistent with the
patient's smoking history and history of reactive airway disease.
There may be subsegmental atelectasis in the left lower lobe
posteriorly. Followup films following anticipated therapy are
recommended if the patient's cough persists.

## 2015-01-24 IMAGING — CR DG CHEST 2V
2 series · 2 of 2 positions shown · non-contrast
Comparison: Two-view chest 11/28/2013

CLINICAL DATA: Wheezing. Immunization counseling. Required chest
x-ray for college.

EXAM:
CHEST  2 VIEW

[PA]
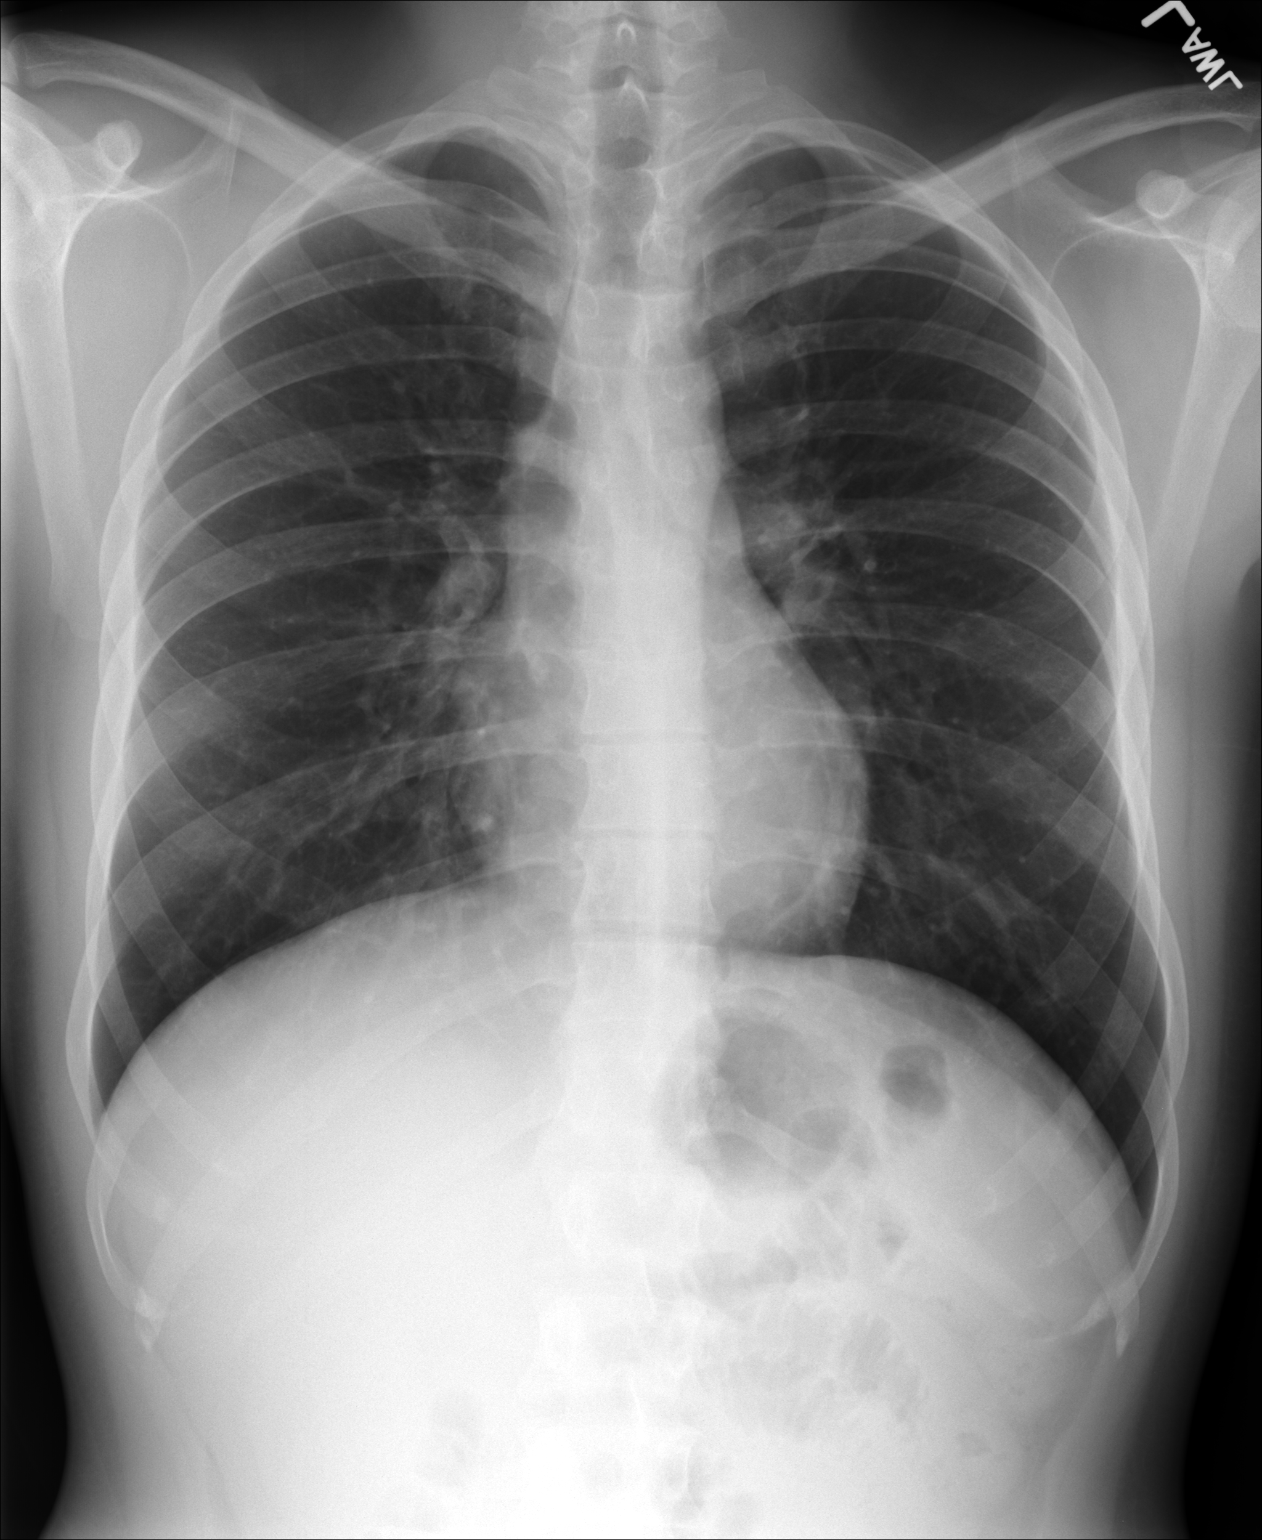

[lateral]
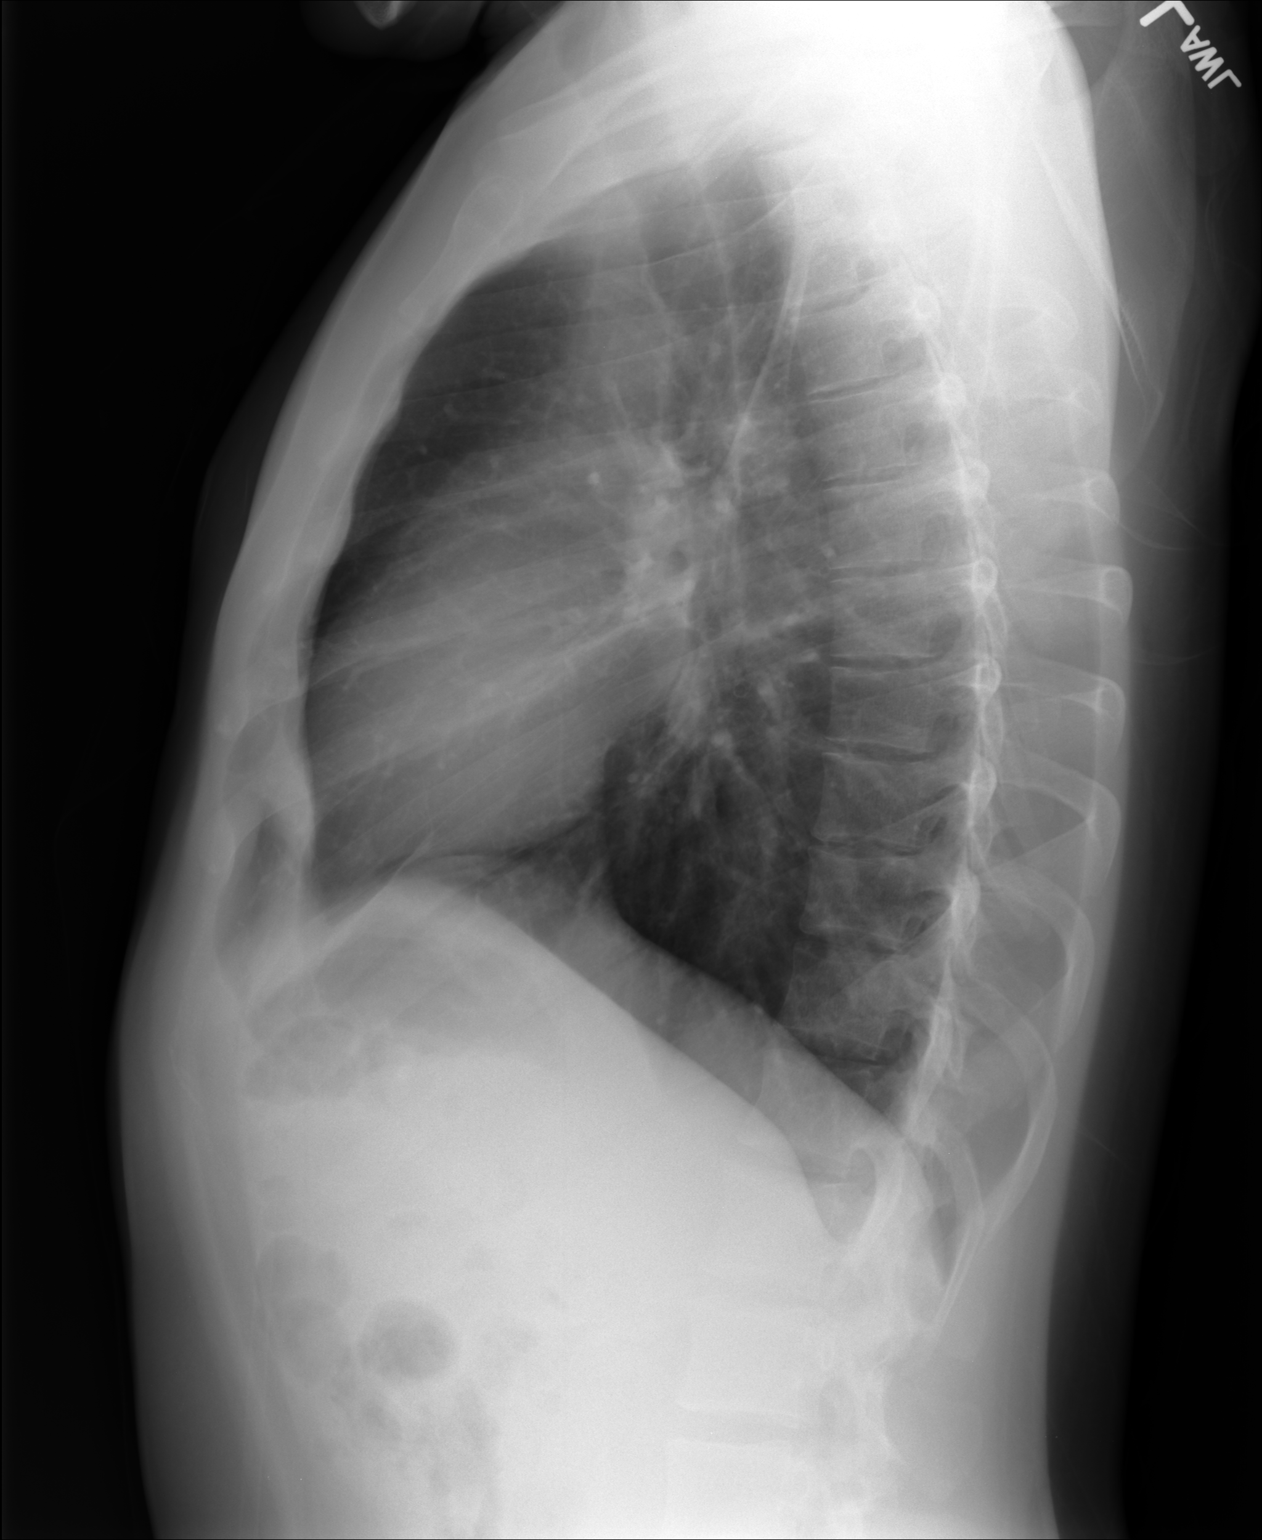

[2 of 2 positions shown; findings below may reference images not displayed]

FINDINGS: Heart size is normal. The lungs are clear. Aeration is improved. The
visualized soft tissues and bony thorax are unremarkable.
IMPRESSION: Negative two view chest x-ray.
